# Patient Record
Sex: Female | Born: 1965 | Race: White | Hispanic: No | Marital: Married | Smoking: Never smoker
Health system: Southern US, Community
[De-identification: ages and names within clinical notes are randomized; demographics above are authoritative.]

---

## 2004-11-05 ENCOUNTER — Emergency Department: Payer: Self-pay | Admitting: Emergency Medicine

## 2007-11-15 ENCOUNTER — Ambulatory Visit: Payer: Self-pay | Admitting: Neurosurgery

## 2007-11-28 ENCOUNTER — Ambulatory Visit: Payer: Self-pay | Admitting: Neurosurgery

## 2007-12-29 ENCOUNTER — Encounter (INDEPENDENT_AMBULATORY_CARE_PROVIDER_SITE_OTHER): Payer: Self-pay | Admitting: *Deleted

## 2007-12-29 ENCOUNTER — Encounter: Admission: RE | Admit: 2007-12-29 | Discharge: 2007-12-29 | Payer: Self-pay | Admitting: Neurosurgery

## 2010-10-28 ENCOUNTER — Other Ambulatory Visit: Payer: Self-pay | Admitting: Family Medicine

## 2010-10-28 ENCOUNTER — Ambulatory Visit: Payer: Self-pay | Admitting: Family Medicine

## 2010-10-28 ENCOUNTER — Ambulatory Visit (INDEPENDENT_AMBULATORY_CARE_PROVIDER_SITE_OTHER): Payer: BC Managed Care – PPO | Admitting: Family Medicine

## 2010-10-28 ENCOUNTER — Encounter: Payer: Self-pay | Admitting: Family Medicine

## 2010-10-28 ENCOUNTER — Telehealth: Payer: Self-pay | Admitting: Family Medicine

## 2010-10-28 DIAGNOSIS — Z136 Encounter for screening for cardiovascular disorders: Secondary | ICD-10-CM

## 2010-10-28 DIAGNOSIS — R109 Unspecified abdominal pain: Secondary | ICD-10-CM

## 2010-10-28 LAB — LIPASE: Lipase: 29 U/L (ref 11.0–59.0)

## 2010-10-28 LAB — HEPATIC FUNCTION PANEL
ALT: 12 U/L (ref 0–35)
AST: 17 U/L (ref 0–37)
Albumin: 4.1 g/dL (ref 3.5–5.2)
Alkaline Phosphatase: 66 U/L (ref 39–117)
Total Bilirubin: 0.3 mg/dL (ref 0.3–1.2)

## 2010-10-28 LAB — BASIC METABOLIC PANEL
BUN: 11 mg/dL (ref 6–23)
Chloride: 108 mEq/L (ref 96–112)
Glucose, Bld: 92 mg/dL (ref 70–99)
Potassium: 4.5 mEq/L (ref 3.5–5.1)

## 2010-10-28 LAB — LIPID PANEL: Cholesterol: 142 mg/dL (ref 0–200)

## 2010-10-29 ENCOUNTER — Telehealth: Payer: Self-pay | Admitting: Family Medicine

## 2010-11-03 ENCOUNTER — Encounter (INDEPENDENT_AMBULATORY_CARE_PROVIDER_SITE_OTHER): Payer: Self-pay | Admitting: *Deleted

## 2010-11-03 ENCOUNTER — Other Ambulatory Visit: Payer: BC Managed Care – PPO

## 2010-11-03 ENCOUNTER — Ambulatory Visit (INDEPENDENT_AMBULATORY_CARE_PROVIDER_SITE_OTHER): Payer: BC Managed Care – PPO | Admitting: Gastroenterology

## 2010-11-03 ENCOUNTER — Other Ambulatory Visit: Payer: Self-pay | Admitting: Gastroenterology

## 2010-11-03 ENCOUNTER — Encounter: Payer: Self-pay | Admitting: Gastroenterology

## 2010-11-03 DIAGNOSIS — R1013 Epigastric pain: Secondary | ICD-10-CM

## 2010-11-03 DIAGNOSIS — R109 Unspecified abdominal pain: Secondary | ICD-10-CM

## 2010-11-05 ENCOUNTER — Telehealth: Payer: Self-pay | Admitting: Gastroenterology

## 2010-11-06 NOTE — Progress Notes (Signed)
Summary: wants rx for omeprazole   Phone Note Call from Patient Call back at Home Phone [phone] P PH     Caller: Patient Call For: Ruthe Mannan MD Summary of Call: Patient is asking for a rx for omeprazole to be called in to [facility] [address] becuase it will be cheaper for her if she has a rx.  Initial call taken by: Melody Comas,  October 29, 2010 2:10 PM    New/Updated Medications: OMEPRAZOLE 20 MG CPDR (OMEPRAZOLE) one by mouth daily Prescriptions: OMEPRAZOLE 20 MG CPDR (OMEPRAZOLE) one by mouth daily  #90 x 3   Entered and Authorized by:   Ruthe Mannan MD   Signed by:   Ruthe Mannan MD on 10/29/2010   Method used:   Electronically to        [facility] [address] (retail)       [address]       [place], [place]  [address]       Ph: [phone]       Fax: [phone]   RxID:   [id number]

## 2010-11-06 NOTE — Assessment & Plan Note (Signed)
Summary: NEW PT TO EST/STOMACH PAIN/CLE   Vital Signs:  Patient profile:   45 year old female Height:      67.5 inches Weight:      157.50 pounds BMI:     24.39 Temp:     98.0 degrees F oral Pulse rate:   68 / minute Pulse rhythm:   regular BP sitting:   120 / 82  (left arm) Cuff size:   regular  Vitals Entered By: Linde Gillis CMA Duncan Dull) (October 28, 2010 9:55 AM) CC: new patient, establish care, stomach pains   History of Present Illness: 45 yo here to establish care and to discuss abdominal pain.  Started approx 3 weeks ago.  Started out dull and intermittent. Has progressed to constant 2/10 pain that progresses to 10/10 pain after eating. Pain is mainly epigastric and RUQ. Was associated with nausea last week, no vomiting. No changes in bowel habits.  No fevers.  Taking Omeprazole OTC which has helped a little bit.    Preventive Screening-Counseling & Management  Alcohol-Tobacco     Smoking Status: never      Drug Use:  no.    Current Medications (verified): 1)  Vitamin D3 5000 Unit Caps (Cholecalciferol) .... Take One Tablet By Mouth Daily 2)  Hydrocodone-Acetaminophen 5-325 Mg Tabs (Hydrocodone-Acetaminophen) .Marland Kitchen.. 1 By Mouth Up To 4 Times Per Day As Needed For Pain  Allergies (verified): 1)  ! 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 Eyes:   Denies blurring. ENT:  Denies difficulty swallowing. CV:  Denies chest pain or discomfort. Resp:  Denies shortness of breath. GI:  Complains of abdominal pain and nausea; denies bloody stools, change in bowel habits, vomiting, and yellowish skin color. GU:  Denies dysuria. MS:  Denies joint pain, joint redness, and joint swelling. Derm:  Denies rash. Neuro:  Denies headaches. Psych:  Denies anxiety and depression. Endo:  Denies cold intolerance and heat intolerance. Heme:  Denies abnormal bruising and bleeding.  Physical Exam  General:  alert, well-developed, and well-nourished.   Head:  normocephalic and atraumatic.   Eyes:  vision grossly intact, pupils equal, and pupils round.   Ears:  R ear normal and L ear normal.   Nose:  no external deformity.   Mouth:  good dentition.   Neck:  No deformities, masses, or tenderness noted. Lungs:  Normal respiratory effort, chest expands symmetrically. Lungs are clear to auscultation, no crackles or wheezes. Heart:  Normal rate and regular rhythm. S1 and S2 normal without gallop, murmur, click, rub or other extra sounds. Abdomen:  soft.   TTP over epigastric area, ? pos murphy's sign. No rebound or guarding. Normal bowel sounds, no bruits. Does have bounding abdominal (widened) pulse Msk:  No deformity or scoliosis noted of thoracic or lumbar spine.   Extremities:  No clubbing, cyanosis, edema, or deformity noted with normal full range of motion of  all joints.   Neurologic:  No cranial nerve deficits noted. Station and gait are normal. Plantar reflexes are down-going bilaterally. DTRs are symmetrical throughout. Sensory, motor and coordinative functions appear intact. Skin:  Intact without suspicious lesions or rashes Psych:  Cognition and judgment appear intact. Alert and cooperative with normal attention span and concentration. No apparent delusions, illusions, hallucinations   Impression & Recommendations:  Problem # 1:  ABDOMINAL PAIN  OTHER SPECIFIED SITE (ICD-789.09) Assessment New Differential is wide but I ? possible gall bladder pathology. Other concern is her exaggerated abdominal pulse- per pt, has been like that since she was a child. Pt is a non smoker. Will order abdominal u/s, BMET, liver function tests, lipase.   Her updated medication list for this problem includes:    Hydrocodone-acetaminophen 5-325 Mg Tabs (Hydrocodone-acetaminophen) .Marland Kitchen... 1 by mouth up to 4 times per day as needed for pain  Orders: Radiology Referral (Radiology) TLB-BMP (Basic Metabolic Panel-BMET) (80048-METABOL) TLB-Hepatic/Liver Function Pnl (80076-HEPATIC) TLB-Lipase (83690-LIPASE)  Complete Medication List: 1)  Vitamin D3 5000 Unit Caps (Cholecalciferol) .... Take one tablet by mouth daily 2)  Hydrocodone-acetaminophen 5-325 Mg Tabs (Hydrocodone-acetaminophen) .Marland Kitchen.. 1 by mouth up to 4 times per day as needed for pain  Other Orders: Venipuncture (21308) TLB-Lipid Panel (80061-LIPID)  Patient Instructions: 1)  It was so nice to meet you. 2)  Please stop by to see Shirlee Limerick on your way out to set up your ultrasound.   Prescriptions: HYDROCODONE-ACETAMINOPHEN 5-325 MG TABS (HYDROCODONE-ACETAMINOPHEN) 1 by mouth up to 4 times per day as needed for pain  #30 x 0   Entered and Authorized by:   Ruthe Mannan MD   Signed by:   Ruthe Mannan MD on 10/28/2010   Method used:   Telephoned to ...       [facility] (retail)       [address], [address]       [place], [place]  [address]       Ph: [phone]       Fax: [phone]   RxID:   [id number]    Orders Added: 1)  Radiology Referral [Radiology] 2)  Venipuncture [38756] 3)  TLB-Lipid Panel [80061-LIPID] 4)  TLB-BMP (Basic Metabolic Panel-BMET) [80048-METABOL] 5)  TLB-Hepatic/Liver Function Pnl [80076-HEPATIC] 6)  TLB-Lipase [83690-LIPASE] 7)  New Patient Level II [99202]    Current Allergies (reviewed today): !  Janean Sark     Past Surgical History:    Denies surgical history

## 2010-11-06 NOTE — Progress Notes (Signed)
Summary: called [place] report  Phone Note From Other Clinic   Caller: Parker at outpatient [place] at [facility] Summary of Call: Called report- abd ultrasound negative, no abnormalities seen. Initial call taken by: Lowella Petties CMA, AAMA,  October 28, 2010 11:41 AM  Follow-up for Phone Call        ok thank you.  Please let pt know.  We will need to schedule a GI work up to evaluate further.  I will place referral. Ruthe Mannan MD  October 28, 2010 11:42 AM  Advised patient as instructed via telephone, she would like GI referral.  Advised her to wait to hear from [facility] Follow-up by: Linde Gillis CMA Duncan Dull),  October 28, 2010 4:33 PM

## 2010-11-12 NOTE — Progress Notes (Signed)
Summary: Meds making her sick  Phone Note Call from Patient Call back at Home Phone [phone]   Call For: Dr Christella Hartigan Reason for Call: Talk to Nurse Summary of Call: Arithromicine is making her really sick, she got it on Monday when she was here. Initial call taken by: Leanor Kail [facility],  November 05, 2010 9:25 AM  Follow-up for Phone Call        left message on machine to call back Chales Abrahams CMA Duncan Dull)  November 05, 2010 1:38 PM   pt states the clarithromycin is making her sick and she has not taken it today, I spoke with Amy  and she advises the pt try Pylera.  Allergies were verified and pt advised to take her nexium as well.  she is to stop the amox, and the clarithromycin.  she will call if she has any further problems Follow-up by: Chales Abrahams CMA Duncan Dull),  November 05, 2010 1:52 PM  Additional Follow-up for Phone Call Additional follow up Details #1::        i agre with trial of pylera Additional Follow-up by: Rachael Fee MD,  November 06, 2010 7:55 AM    New/Updated Medications: PYLERA 140-125-125 MG CAPS (BIS SUBCIT-METRONID-TETRACYC) 3 pills four times daily on a full stomach for 9 days Prescriptions: PYLERA 140-125-125 MG CAPS (BIS SUBCIT-METRONID-TETRACYC) 3 pills four times daily on a full stomach for 9 days  #108 x 0   Entered by:   Chales Abrahams CMA (AAMA)   Authorized by:   Rachael Fee MD   Signed by:   Chales Abrahams CMA (AAMA) on 11/05/2010   Method used:   Electronically to        [facility] (retail)       [address], [address]       [place], [place]  [address]       Ph: [phone]       Fax: [phone]   RxID:   [id number]

## 2010-11-12 NOTE — Assessment & Plan Note (Signed)
History of Present Illness Visit Type: Initial Consult Primary GI MD: Rob Bunting MD Primary Provider: Ruthe Mannan, MD Requesting Provider: Ruthe Mannan, MD Chief Complaint: abdominal pain x 3 weeks History of Present Illness:     very pleasant 45 year old woman who has had 3 weeks of abd pains.  feels like a knot.  pain is constant but worsens after eating.   was taking 1600mg  ibuprofen a day off and on for 4 years for lower back pains (ruptured disc L4/L5),  but nearly daily for several months until 2 weeks.  She stopped taking them 2 weeks ago because pains.   started taking omeprazole one pill a day for 5 days, also started ranitidine a week ago.  She has not noticed much improvement since stopping NSAIDs, started PPI.  Thinks she has lost weight.  she had blood work and ultrasound done earlier this week. Ultrasound showed normal gallbladder, normal bile duct. Lab tests showed normal CBC, normal complete metabolic profile, normal pancreatic enzymes.           Current Medications (verified): 1)  Vitamin D3 5000 Unit Caps (Cholecalciferol) .... Take One Tablet By Mouth Daily 2)  Hydrocodone-Acetaminophen 5-325 Mg Tabs (Hydrocodone-Acetaminophen) .Marland Kitchen.. 1 By Mouth Up To 4 Times Per Day As Needed For Pain 3)  Omeprazole 20 Mg Cpdr (Omeprazole) .... One By Mouth Daily 4)  Zantac 150 Mg Tabs (Ranitidine Hcl) .... Take 1 in The Evening As Needed  Allergies (verified): 1)  ! 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  All other review of systems was otherwise negative.   Vital  Signs:  Patient profile:   45 year old female Height:      67.5 inches Weight:      159 pounds BMI:     24.62 Pulse rate:   68 / minute Pulse rhythm:   regular BP sitting:   108 / 60  (left arm)  Vitals Entered By: Milford Cage NCMA (November 03, 2010 9:39 AM)  Physical Exam  Additional Exam:  Constitutional: generally well appearing Psychiatric: alert and oriented times 3 Eyes: extraocular movements intact Mouth: oropharynx moist, no lesions Neck: supple, no lymphadenopathy Cardiovascular: heart regular rate and rythm Lungs: CTA bilaterally Abdomen: soft, Mildly tender upper epigastrium, non-distended, no obvious ascites, no peritoneal signs, normal bowel sounds Extremities: no lower extremity edema bilaterally Skin: no lesions on visible extremities    Impression & Recommendations:  Problem # 1:  epigastric pain, likely peptic ulcer disease related to NSAID use she was taking 1.6 g of ibuprofen daily for several months, years. This can certainly cause an ulcer in the stomach. Her symptoms are  p consistent with gastric or duodenal ulcer. I offered upper endoscopy but she and her husband want to simply treated as if it is an ulcer I think it is completely reasonable. I have changed her proton pump inhibitor. She will take Nexium twice daily for the next 2 weeks at least and then she will back down to once daily proton pump inhibitor. She will absolutely avoid all NSAIDs.  We will test her for H. pylori he antibodies and if positive we will treat her with appropriate antibiotics. She will call my office in 3-4 weeks to report on her symptoms, sooner if needed. I did give her a prescription for higher dose narcotic pain medicine, 30 pills. I do not think she is drug seeking. Her husband is on methadone however and I explained to her that I would not refill these unless we proceed with EGD to confirm the ulcer disease.  Other Orders: TLB-H. Pylori Abs(Helicobacter Pylori)  (86677-HELICO)  Patient Instructions: 1)  You will get lab test(s) done today (h. pylori serology). 2)  Samples of nexium, take one pill twice a day (for two weeks), then back down to once a day nexium (new script written). 3)  Call Dr. Christella Hartigan in 3-4 weeks to report on your symptoms, sooner if needed. 4)  Absolutely avoid NSAIDs. 5)  Tylenol is safe for your stomach but be careful about overall dosing. 6)  New script for vicodin type pain meds.  7)  A copy of this information will be sent to Dr. Dayton Martes. 8)  The medication list was reviewed and reconciled.  All changed / newly prescribed medications were explained.  A complete medication list was provided to the patient / caregiver. Prescriptions: NEXIUM 40 MG  CPDR (ESOMEPRAZOLE MAGNESIUM) 1 capsule each day 30 minutes before meal  #30 x 5   Entered and Authorized by:   Rachael Fee MD   Signed by:   Rachael Fee MD on 11/03/2010   Method used:   Electronically to        [facility]ail)       [address], [address]       [place], [place]  [address]       Ph: [phone]       Fax: [phone]   RxID:   [id number] HYDROCODONE-ACETAMINOPHEN 10-500 MG  TABS (HYDROCODONE-ACETAMINOPHEN) take one pill 2-3 times a day as needed  #30 x 0   Entered and Authorized by:   Rachael Fee MD   Signed by:   Rachael Fee MD on 11/03/2010   Method used:   Print then Give to Patient   RxID:   [id number]   Appended Document: nexium samples    Clinical Lists Changes  Medications: Added new medication of NEXIUM 40 MG CPDR (ESOMEPRAZOLE MAGNESIUM)

## 2010-12-02 NOTE — Letter (Signed)
Summary: Patient medical record  Patient medical record   Imported By: Kassie Mends 11/27/2010 11:37:13  _____________________________________________________________________  External Attachment:    Type:   Image     Comment:   External Document

## 2011-07-01 ENCOUNTER — Encounter: Payer: Self-pay | Admitting: Family Medicine

## 2011-07-01 ENCOUNTER — Ambulatory Visit (INDEPENDENT_AMBULATORY_CARE_PROVIDER_SITE_OTHER): Payer: BC Managed Care – PPO | Admitting: Family Medicine

## 2011-07-01 DIAGNOSIS — R079 Chest pain, unspecified: Secondary | ICD-10-CM | POA: Insufficient documentation

## 2011-07-01 MED ORDER — OMEPRAZOLE 40 MG PO CPDR: DELAYED_RELEASE_CAPSULE | ORAL | Status: DC

## 2011-07-01 NOTE — Progress Notes (Signed)
  Subjective:    Patient ID: Danielle Stokes, female    DOB: [birth year], 45 y.o.   MRN: [id number]  HPI  45 yo here for abrupt onset of CP when she woke up yesterday. Pain is in center of her chest, no radiation. Worsens with palpation and deep breaths. Not really worsened by exertion, only if she takes deep breaths.  No recent URI or illnesses.  Never had anything like this before.  No known trauma.  Pt is a non smoker, no FH of CAD.  H/o GI ulcer, staying away from NSAIDs so has only taken Tylenol for this.  Patient Active Problem List  Diagnoses  . ABDOMINAL PAIN OTHER SPECIFIED SITE  . Chest pain   No past medical history on file. No past surgical history on file. History  Substance Use Topics  . Smoking status: Never Smoker   . Smokeless tobacco: Not on file  . Alcohol Use: No   Family History  Problem Relation Age of Onset  . Arthritis Mother   . Stroke Other    Allergies  Allergen Reactions  . Tramadol Hcl    No current outpatient prescriptions on file prior to visit.         Review of Systems     Objective:   Physical Exam BP 120/86  Pulse 61  Temp(Src) 98.3 F (36.8 C) (Oral)  Ht 5' 7.5" (1.715 m)  Wt 165 lb 4 oz (74.957 kg)  BMI 25.50 kg/m2  LMP 03/21/2009  General:  Well-developed,well-nourished,in no acute distress; alert,appropriate and cooperative throughout examination Head:  normocephalic and atraumatic.   Eyes:  vision grossly intact, pupils equal, pupils round, and pupils reactive to light.   Ears:  R ear normal and L ear normal.   Nose:  no external deformity.   Mouth:  good dentition.   Neck:  No deformities, masses, or tenderness noted. Chest wall: TTP over costochondrol junction of chest, no erythema or warmth Lungs:  Normal respiratory effort, chest expands symmetrically. Lungs are clear to auscultation, no crackles or wheezes. Heart:  Normal rate and regular rhythm. S1 and S2 normal without gallop, murmur, click, rub or  other extra sounds. Psych:  Cognition and judgment appear intact. Alert and cooperative with normal attention span and concentration. No apparent delusions, illusions, hallucinations      Assessment & Plan:   1. Chest pain   New.  Very consistent with costochondritis. Advised supportive care- see pt instructions for details. EKG unremarkable- NSR with low volatege (likely her normal variant).

## 2011-07-01 NOTE — Patient Instructions (Signed)
Good to see you. Please let me know if you're symptoms are not better in next few days. Go straight to ER if symptoms worsen.  Costochondritis (Costochondral Separation / Tietze Syndrome) Costochondritis (Tietze syndrome), or costochondral separation, is a swelling and irritation (inflammation) of the tissue (cartilage) that connects your ribs with your breastbone (sternum). It may occur on its own (spontaneously), through damage caused by an accident (trauma), or simply from coughing or minor exercise. It may take up to 6 weeks to get better and longer if you are unable to be conservative in your activities. HOME CARE INSTRUCTIONS  Avoid exhausting physical activity. Try not to strain your ribs during normal activity. This would include any activities using chest, belly (abdominal) and side muscles, especially if heavy weights are used.   Only take over-the-counter or prescription medicines for pain, discomfort, or fever as directed by your caregiver.  SEEK IMMEDIATE MEDICAL CARE IF:  Your pain increases or you are very uncomfortable.   You develop worse chest pains, shortness of breath, sweating, or vomiting.   You develop new, unexplained problems (symptoms).  MAKE SURE YOU:   Understand these instructions.   Will watch your condition.   Will get help right away if you are not doing well or get worse.  Document Released: 06/17/2005 Document Re-Released: 12/02/2009 [facility] Patient Information 2011 Standard, [place].

## 2011-09-01 ENCOUNTER — Other Ambulatory Visit: Payer: Self-pay | Admitting: Family Medicine

## 2012-07-25 IMAGING — US ABDOMEN ULTRASOUND LIMITED
1 series · 17 of 25 positions shown · non-contrast
Comparison: none

REASON FOR EXAM: CR [id number]  RUQ abdominal pain eval gallbladder and
aorta for widened abdomi...
COMMENTS:

[Series 1: abdomen ultrasound limited · 17 of 67 slices shown]
[im 1/67]
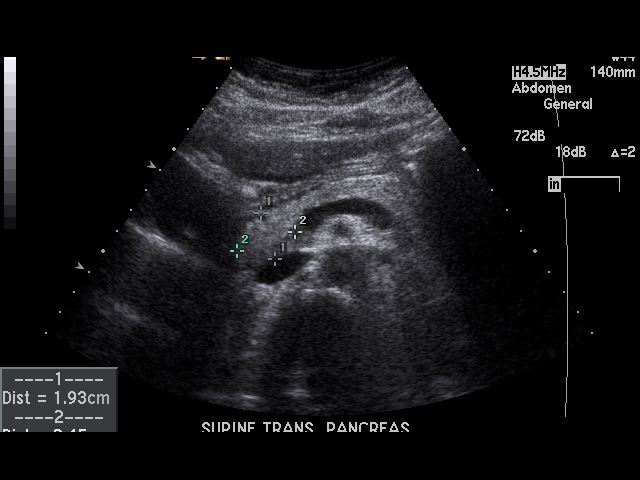
[im 6/67]
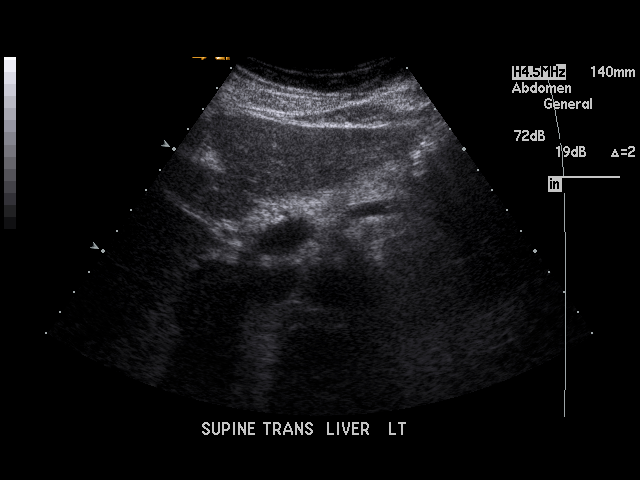
[im 9/67]
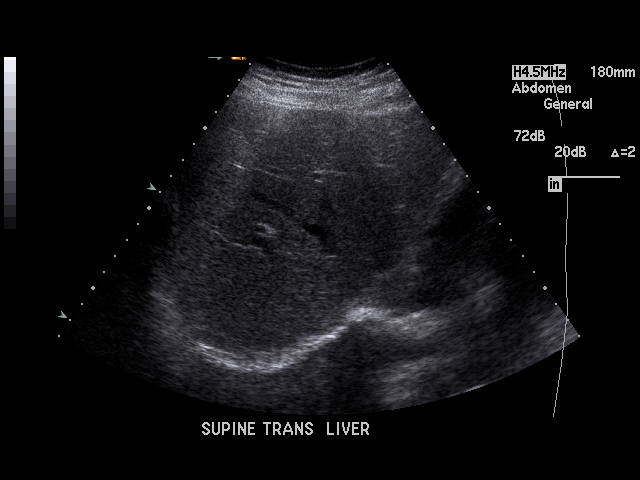
[im 14/67]
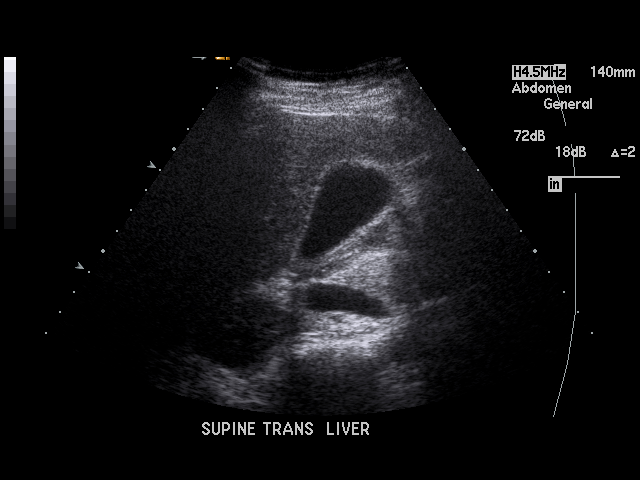
[im 17/67]
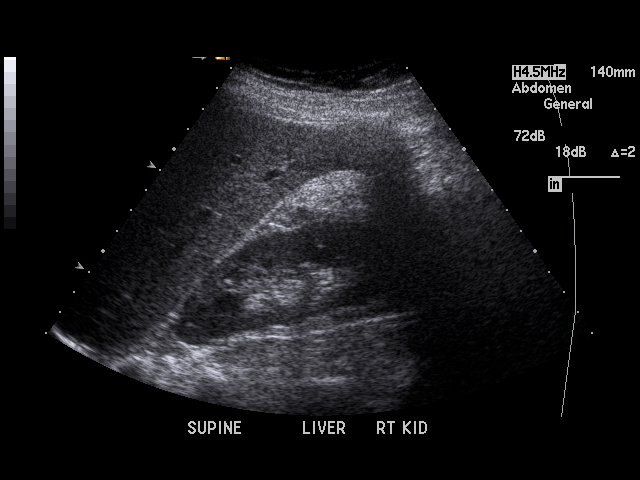
[im 23/67]
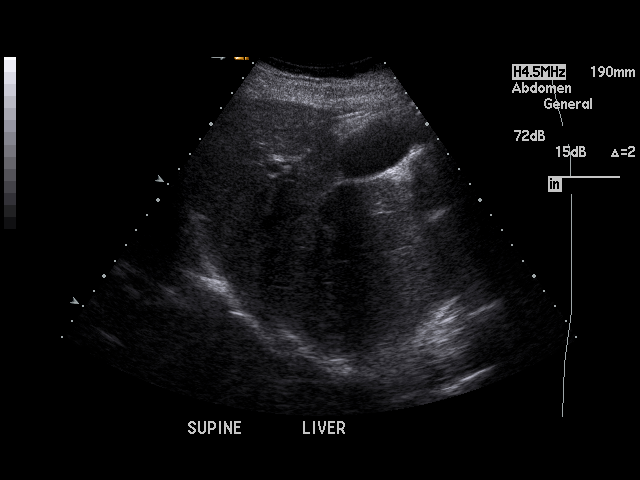
[im 25/67]
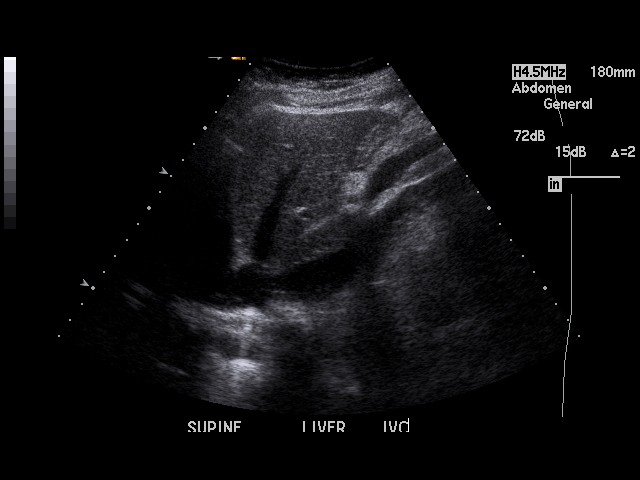
[im 31/67]
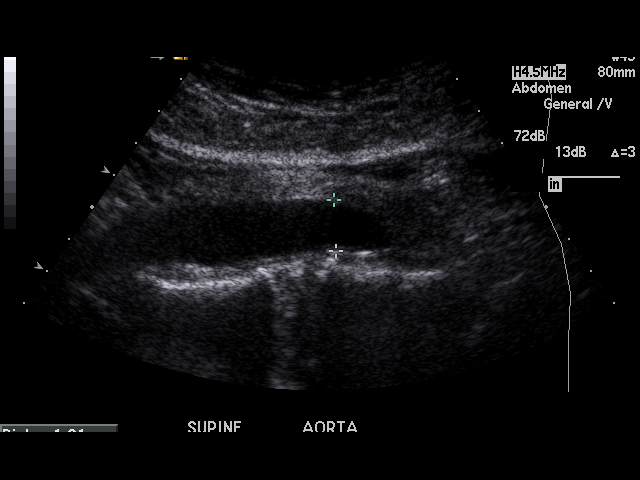
[im 34/67]
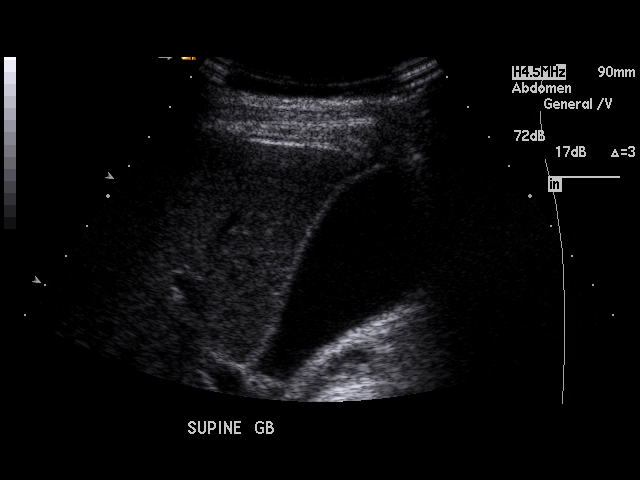
[im 36/67]
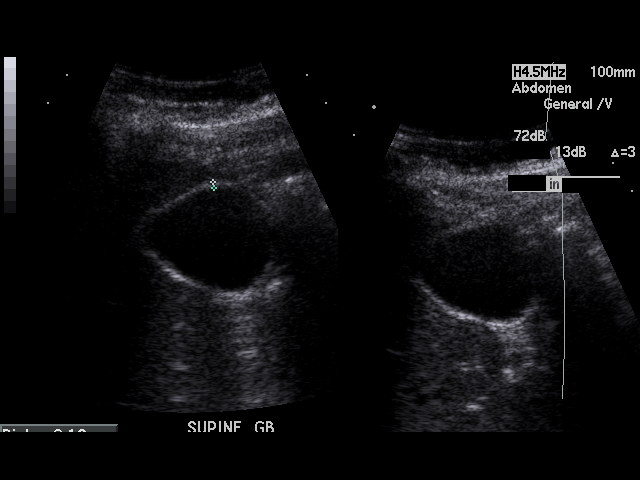
[im 42/67]
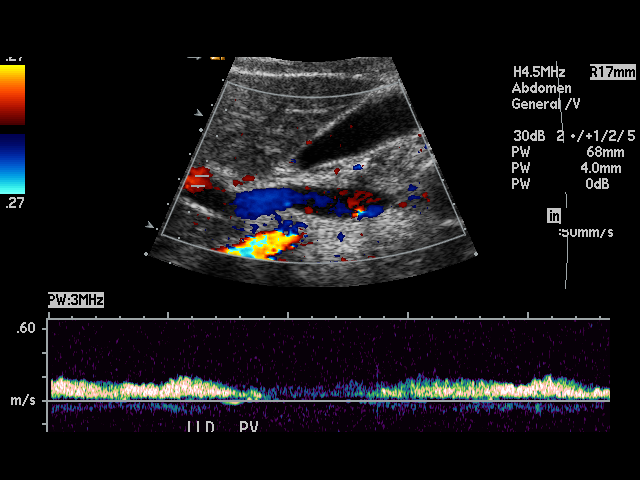
[im 45/67]
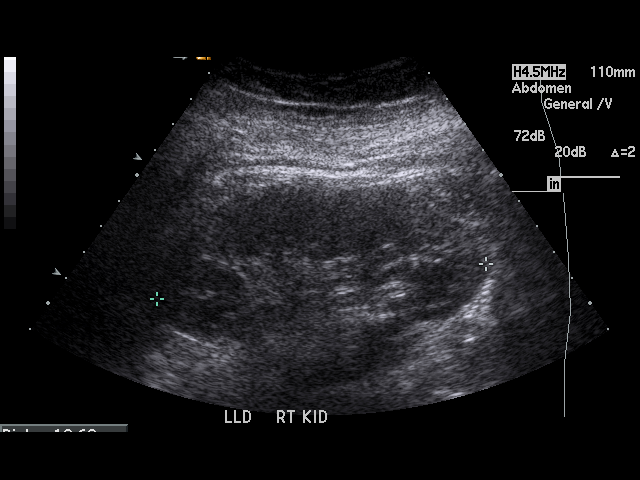
[im 50/67]
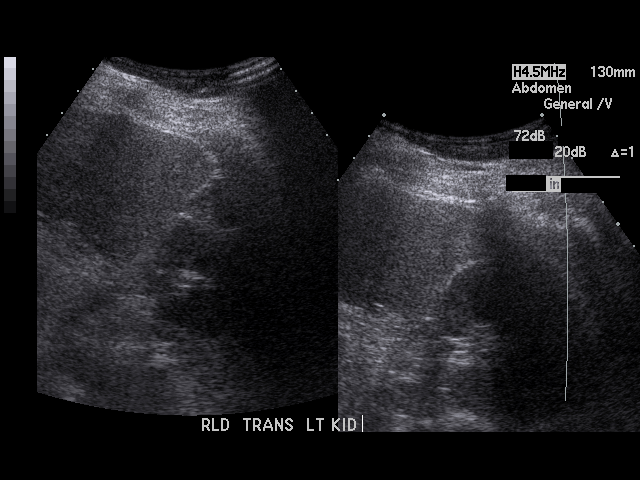
[im 53/67]
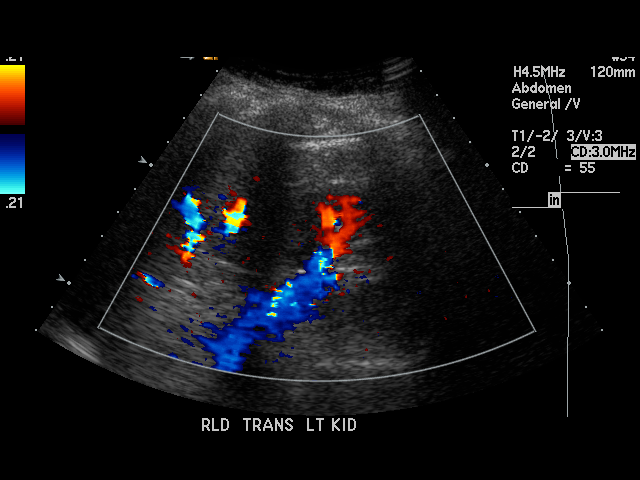
[im 58/67]
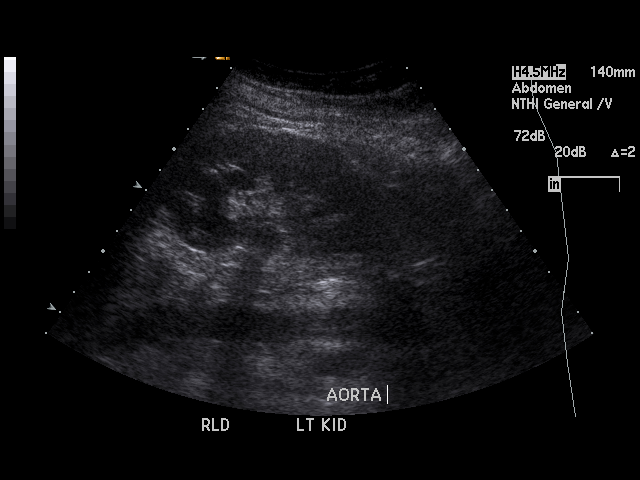
[im 61/67]
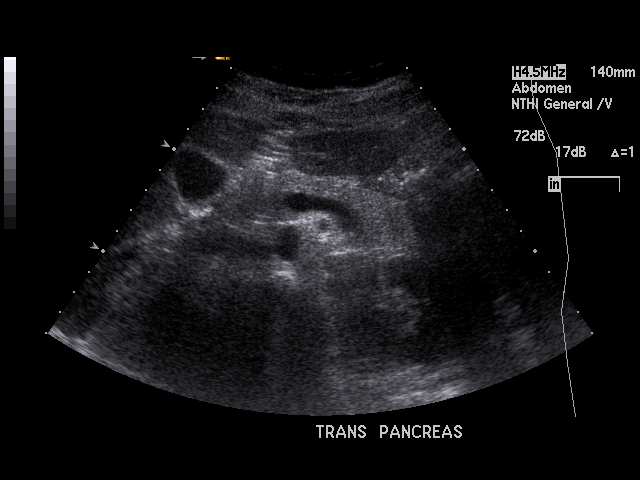
[im 67/67]
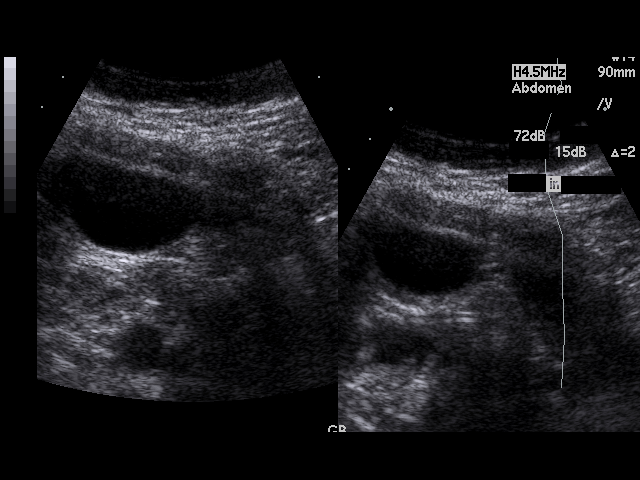

[17 of 25 positions shown; findings below may reference images not displayed]

PROCEDURE:     ML - ML ABDOMEN UPPER GENERAL  - October 28, 2010 [DATE]

RESULT:     Sonographic evaluation of the abdomen demonstrates the
visualized pancreas appears normal. There is fairly good visualization of
the entire pancreas. The liver is normal in echotexture and size without
ductal dilation. The right kidney length is 10.6 cm. The left kidney length
is 10.8 cm. The gallbladder is fluid-filled without stones. The common bile
duct diameter is 2.8 mm. The gallbladder wall is 1.8 mm. The spleen, aorta
and inferior vena cava appear to be normal. The portal venous flow is
normal. No sonographic Murphy's sign is present.
IMPRESSION: Normal appearing abdominal sonogram.

## 2012-10-27 ENCOUNTER — Other Ambulatory Visit: Payer: Self-pay | Admitting: Family Medicine

## 2013-01-11 ENCOUNTER — Other Ambulatory Visit: Payer: Self-pay | Admitting: Family Medicine

## 2016-06-20 ENCOUNTER — Emergency Department
Admission: EM | Admit: 2016-06-20 | Discharge: 2016-06-20 | Disposition: A | Discharge status: Home / Self Care | Payer: BLUE CROSS/BLUE SHIELD | Attending: Student | Admitting: Student

## 2016-06-20 ENCOUNTER — Emergency Department: Payer: BLUE CROSS/BLUE SHIELD

## 2016-06-20 ENCOUNTER — Encounter: Payer: Self-pay | Admitting: Emergency Medicine

## 2016-06-20 DIAGNOSIS — Y9302 Activity, running: Secondary | ICD-10-CM | POA: Insufficient documentation

## 2016-06-20 DIAGNOSIS — S42251A Displaced fracture of greater tuberosity of right humerus, initial encounter for closed fracture: Secondary | ICD-10-CM | POA: Insufficient documentation

## 2016-06-20 DIAGNOSIS — Y999 Unspecified external cause status: Secondary | ICD-10-CM | POA: Diagnosis not present

## 2016-06-20 DIAGNOSIS — W010XXA Fall on same level from slipping, tripping and stumbling without subsequent striking against object, initial encounter: Secondary | ICD-10-CM | POA: Diagnosis not present

## 2016-06-20 DIAGNOSIS — S4991XA Unspecified injury of right shoulder and upper arm, initial encounter: Secondary | ICD-10-CM | POA: Diagnosis present

## 2016-06-20 DIAGNOSIS — Y92000 Kitchen of unspecified non-institutional (private) residence as  the place of occurrence of the external cause: Secondary | ICD-10-CM | POA: Insufficient documentation

## 2016-06-20 DIAGNOSIS — W19XXXA Unspecified fall, initial encounter: Secondary | ICD-10-CM

## 2016-06-20 MED ORDER — HYDROCODONE-ACETAMINOPHEN 5-325 MG PO TABS: 1.0000 | ORAL_TABLET | ORAL | 0 refills | Status: AC | PRN

## 2016-06-20 MED ORDER — HYDROMORPHONE HCL 1 MG/ML IJ SOLN
1.0000 mg | Freq: Once | INTRAMUSCULAR | Status: AC
Start: 2016-06-20 — End: 2016-06-20
  Administered 2016-06-20: 1 mg via INTRAVENOUS
  Filled 2016-06-20: qty 1

## 2016-06-20 MED ORDER — ONDANSETRON HCL 4 MG/2ML IJ SOLN
4.0000 mg | Freq: Once | INTRAMUSCULAR | Status: AC
  Administered 2016-06-20: 4 mg via INTRAVENOUS
  Filled 2016-06-20: qty 2

## 2016-06-20 NOTE — ED Provider Notes (Signed)
[facility] Emergency Department Provider Note   ____________________________________________   None    (approximate)  I have reviewed the triage vital signs and the nursing notes.   HISTORY  Chief Complaint Arm Injury    HPI Danielle Stokes is a 50 y.o. female presents for evaluation of right shoulder pain status post fall prior to arrival. Patient states she fell on an outstretched arm and has had severe pain in her upper right shoulder area ever since. Denies any loss consciousness. Denies any numbness or tingling.   History reviewed. No pertinent past medical history.  Patient Active Problem List   Diagnosis Date Noted  . Chest pain 07/01/2011  . ABDOMINAL PAIN OTHER SPECIFIED SITE 10/28/2010    History reviewed. No pertinent surgical history.  Prior to Admission medications   Medication Sig Start Date End Date Taking? Authorizing Provider  Cholecalciferol (VITAMIN D-3) 5000 UNITS TABS Take 1 tablet by mouth daily.      Historical Provider, MD  HYDROcodone-acetaminophen (NORCO) 5-325 MG tablet Take 1-2 tablets by mouth every 4 (four) hours as needed for moderate pain. 06/20/16   Charmayne Sheer Sady Monaco, PA-C  omeprazole (PRILOSEC) 40 MG capsule TAKE ONE CAPSULE BY MOUTH EVERY DAY 10/27/12   Dianne Dun, MD    Allergies Ibuprofen; Percocet [oxycodone-acetaminophen]; and Tramadol hcl  Family History  Problem Relation Age of Onset  . Stroke Other   . Arthritis Mother     Social History Social History  Substance Use Topics  . Smoking status: Never Smoker  . Smokeless tobacco: Never Used  . Alcohol use No    Review of Systems Constitutional: No fever/chills Musculoskeletal: Positive for right shoulder pain. Skin: Negative for rash. Neurological: Negative for headaches, focal weakness or numbness.  10-point ROS otherwise negative.  ____________________________________________   PHYSICAL EXAM:  VITAL SIGNS: ED Triage Vitals  [06/20/16 1259]  Enc Vitals Group     BP 116/75     Pulse Rate 89     Resp 18     Temp 98.2 F (36.8 C)     Temp Source Oral     SpO2 98 %     Weight 162 lb (73.5 kg)     Height 5' 7.5" (1.715 m)     Head Circumference      Peak Flow      Pain Score 7     Pain Loc      Pain Edu?      Excl. in GC?     Constitutional: Alert and oriented. Well appearing and in no acute distress. Gastrointestinal: Soft and nontender. No distention. No abdominal bruits. No CVA tenderness. Musculoskeletal: Right arm currently in a sling with limited range of motion. Unable to abduct and adduct extend. Distally neurovascularly intact with good capillary refill. Neurologic:  Normal speech and language. No gross focal neurologic deficits are appreciated. Skin:  Skin is warm, dry and intact. No rash noted. Psychiatric: Mood and affect are normal. Speech and behavior are normal.  ____________________________________________   LABS (all labs ordered are listed, but only abnormal results are displayed)  Labs Reviewed - No data to display ____________________________________________  EKG   ____________________________________________  RADIOLOGY  IMPRESSION:  Minimally displaced fracture involving the greater tuberosity of the  proximal humerus.    ____________________________________________   PROCEDURES  Procedure(s) performed: None  Procedures  Critical Care performed: No  ____________________________________________   INITIAL IMPRESSION / ASSESSMENT AND PLAN / ED COURSE  Pertinent labs & imaging results  that were available during my care of the patient were reviewed by me and considered in my medical decision making (see chart for details).  Acute right humeral fracture. Rx given for Vicodin 5/325. Patient follow-up with Dr. Hyacinth MeekerMiller, the orthopedic doctor on call. Discussed all clinical finding with orthopedics.  Clinical Course      ____________________________________________   FINAL CLINICAL IMPRESSION(S) / ED DIAGNOSES  Final diagnoses:  Fall, initial encounter  Greater tuberosity of humerus fracture, right, closed, initial encounter      NEW MEDICATIONS STARTED DURING THIS VISIT:  New Prescriptions   HYDROCODONE-ACETAMINOPHEN (NORCO) 5-325 MG TABLET    Take 1-2 tablets by mouth every 4 (four) hours as needed for moderate pain.     Note:  This document was prepared using Dragon voice recognition software and may include unintentional dictation errors.   Evangeline Dakinharles M Yesika Rispoli, PA-C 06/20/16 1443    Gayla DossEryka A Gayle, MD 06/20/16 [phone]

## 2016-06-20 NOTE — ED Triage Notes (Signed)
Pt presents c/o possible R arm fracture near shoulder joint. Pt states she ran to catch her dog and tripped and fell onto kitchen floor. [facility]. Sensation intact, able to move fingers.

## 2016-06-20 NOTE — ED Notes (Signed)
See triage note  States she tripped last pm  Having pain to right uppr arm/shoulder area.Danielle Stokes. Positive pulses

## 2017-12-09 ENCOUNTER — Other Ambulatory Visit: Payer: BLUE CROSS/BLUE SHIELD

## 2018-03-18 IMAGING — CR DG SHOULDER 2+V*R*
1 series · 3 of 3 positions shown · non-contrast
Comparison: None.

CLINICAL DATA: Pain.  Fall.

EXAM:
RIGHT SHOULDER - 2+ VIEW

[Series 1: dg shoulder right · 0.14mm/px · 3 of 3 slices shown]
[im 1/3]
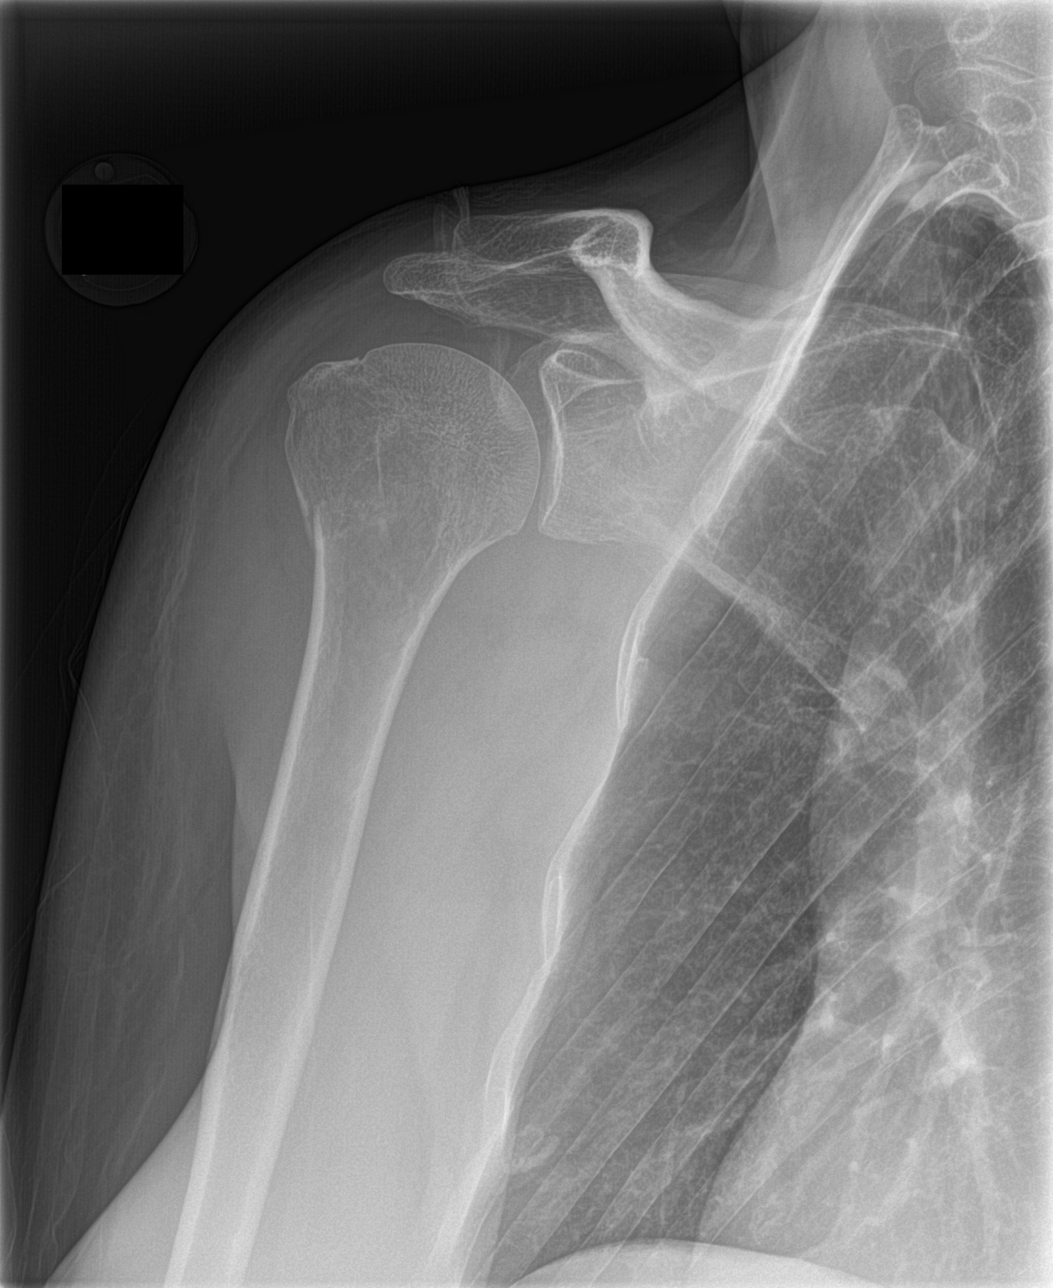
[im 2/3]
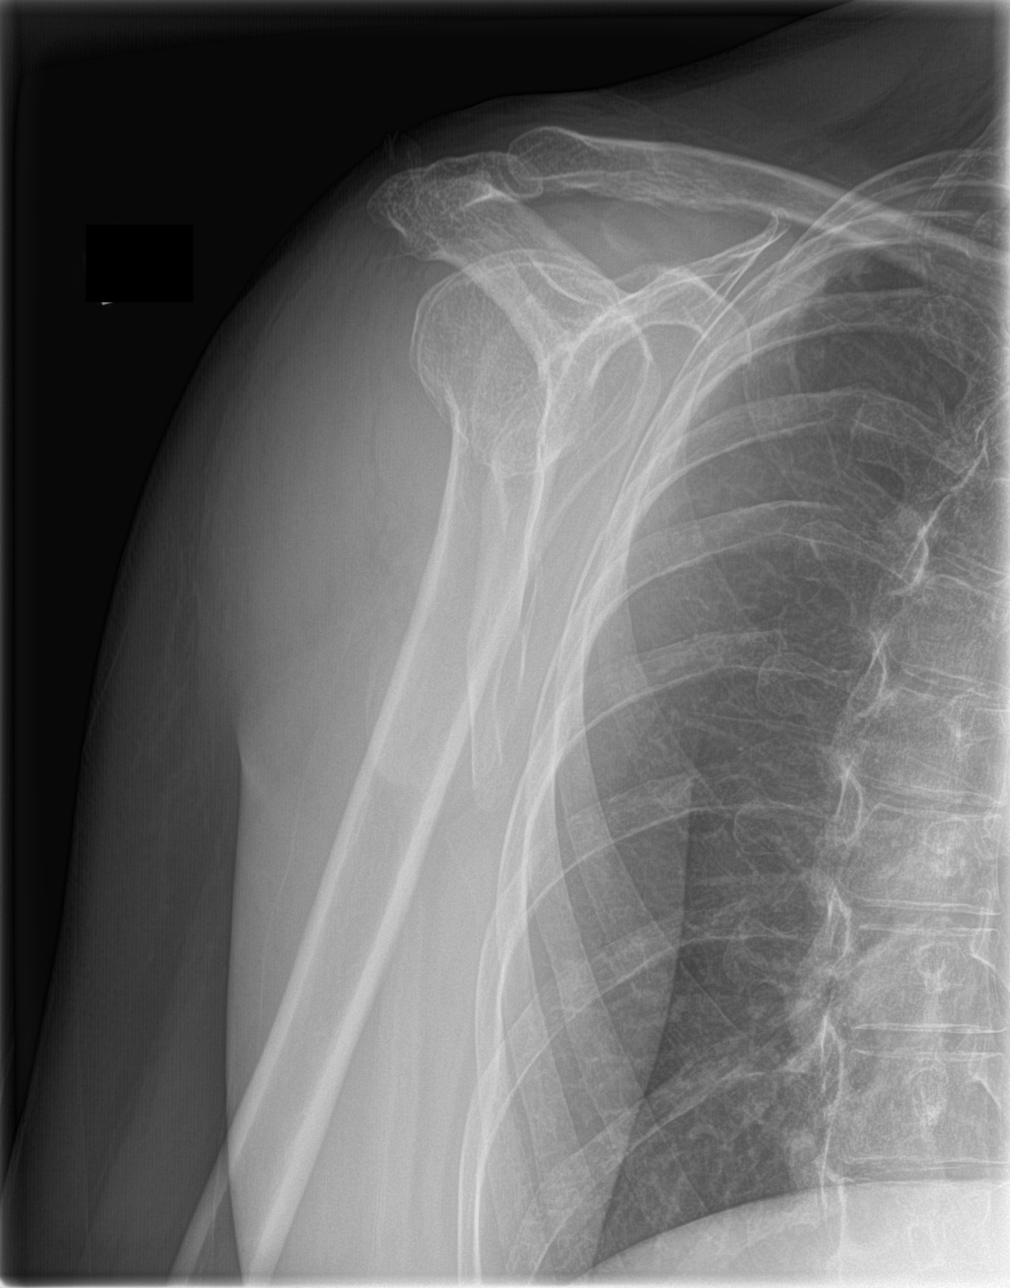
[im 3/3]
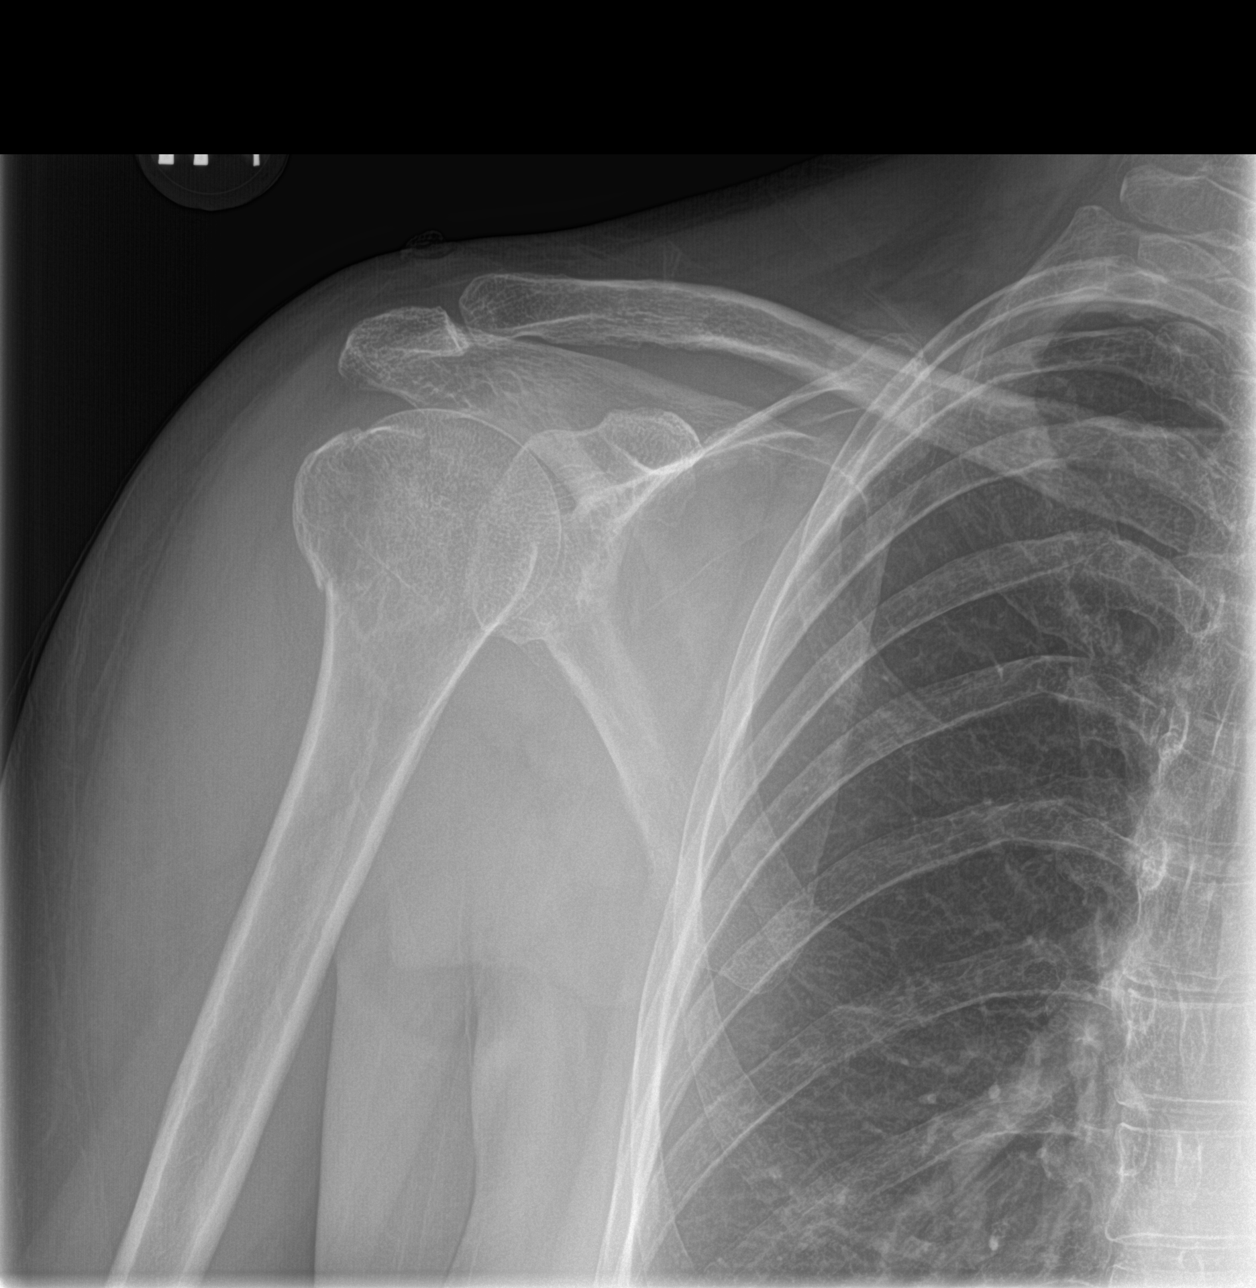

[3 of 3 positions shown; findings below may reference images not displayed]

FINDINGS: There is a minimally displaced fracture involving the proximal
humerus. The fracture involves the greater tuberosity. Humeral neck
is grossly intact. Osteopenia. Scapula and clavicle are grossly
intact.
IMPRESSION: Minimally displaced fracture involving the greater tuberosity of the
proximal humerus.

## 2022-07-29 ENCOUNTER — Ambulatory Visit
Admission: EM | Admit: 2022-07-29 | Discharge: 2022-07-29 | Disposition: A | Discharge status: Home / Self Care | Payer: BC Managed Care – PPO

## 2022-07-29 DIAGNOSIS — H9203 Otalgia, bilateral: Secondary | ICD-10-CM | POA: Diagnosis not present

## 2022-07-29 DIAGNOSIS — J069 Acute upper respiratory infection, unspecified: Secondary | ICD-10-CM | POA: Diagnosis not present

## 2022-07-29 NOTE — ED Provider Notes (Signed)
[facility]    CSN: [id number] Arrival date & time: 07/29/22  1211      History   Chief Complaint Chief Complaint  Patient presents with   Otalgia    HPI Danielle Stokes is a 56 y.o. female. Patient presents with 3 day history of bilateral ear pain, worse on left.  She also reports congestion and mild cough.  No fever, rash, shortness of breath, vomiting, diarrhea, or other symptoms.  Treatment at home with Tylenol.    The history is provided by the patient and medical records.    History reviewed. No pertinent past medical history.  Patient Active Problem List   Diagnosis Date Noted   Chest pain 07/01/2011   ABDOMINAL PAIN OTHER SPECIFIED SITE 10/28/2010    History reviewed. No pertinent surgical history.  OB History   No obstetric history on file.      Home Medications    Prior to Admission medications   Medication Sig Start Date End Date Taking? Authorizing Provider  Cholecalciferol (VITAMIN D-3) 5000 UNITS TABS Take 1 tablet by mouth daily.      [provider]  HYDROcodone-acetaminophen (NORCO) 5-325 MG tablet Take 1-2 tablets by mouth every 4 (four) hours as needed for moderate pain. 06/20/16   Beers, Charmayne Sheer, PA-C  omeprazole (PRILOSEC) 40 MG capsule TAKE ONE CAPSULE BY MOUTH EVERY DAY 10/27/12   Dianne Dun, MD    Family History Family History  Problem Relation Age of Onset   Stroke Other    Arthritis Mother     Social History Social History   Tobacco Use   Smoking status: Never   Smokeless tobacco: Never  Substance Use Topics   Alcohol use: No   Drug use: No     Allergies   Ibuprofen, Percocet [oxycodone-acetaminophen], and Tramadol hcl   Review of Systems Review of Systems  Constitutional:  Negative for chills and fever.  HENT:  Positive for congestion and ear pain. Negative for sore throat.   Respiratory:  Positive for cough. Negative for shortness of breath.   Cardiovascular:  Negative for chest pain and  palpitations.  Gastrointestinal:  Negative for diarrhea and vomiting.  Skin:  Negative for rash.  All other systems reviewed and are negative.    Physical Exam Triage Vital Signs ED Triage Vitals  Enc Vitals Group     BP 07/29/22 1237 110/75     Pulse Rate 07/29/22 1231 68     Resp 07/29/22 1231 18     Temp 07/29/22 1231 98.4 F (36.9 C)     Temp src --      SpO2 07/29/22 1231 97 %     Weight 07/29/22 1235 162 lb (73.5 kg)     Height 07/29/22 1235 5' 7.5" (1.715 m)     Head Circumference --      Peak Flow --      Pain Score 07/29/22 1235 7     Pain Loc --      Pain Edu? --      Excl. in GC? --    No data found.  Updated Vital Signs BP 110/75   Pulse 68   Temp 98.4 F (36.9 C)   Resp 18   Ht 5' 7.5" (1.715 m)   Wt 162 lb (73.5 kg)   LMP 03/21/2009   SpO2 97%   BMI 25.00 kg/m   Visual Acuity Right Eye Distance:   Left Eye Distance:   Bilateral Distance:  Right Eye Near:   Left Eye Near:    Bilateral Near:     Physical Exam Vitals and nursing note reviewed.  Constitutional:      General: She is not in acute distress.    Appearance: Normal appearance. She is well-developed. She is not ill-appearing.  HENT:     Right Ear: Tympanic membrane and ear canal normal.     Left Ear: Tympanic membrane and ear canal normal.     Nose: Nose normal.     Mouth/Throat:     Mouth: Mucous membranes are moist.     Pharynx: Oropharynx is clear.  Cardiovascular:     Rate and Rhythm: Normal rate and regular rhythm.     Heart sounds: Normal heart sounds.  Pulmonary:     Effort: Pulmonary effort is normal. No respiratory distress.     Breath sounds: Normal breath sounds.  Musculoskeletal:     Cervical back: Neck supple.  Skin:    General: Skin is warm and dry.  Neurological:     Mental Status: She is alert.  Psychiatric:        Mood and Affect: Mood normal.        Behavior: Behavior normal.      UC Treatments / Results  Labs (all labs ordered are listed, but  only abnormal results are displayed) Labs Reviewed - No data to display  EKG   Radiology No results found.  Procedures Procedures (including critical care time)  Medications Ordered in UC Medications - No data to display  Initial Impression / Assessment and Plan / UC Course  I have reviewed the triage vital signs and the nursing notes.  Pertinent labs & imaging results that were available during my care of the patient were reviewed by me and considered in my medical decision making (see chart for details).    Viral URI, otalgia.  No indication of ear infection today.  Discussed symptomatic treatment including Flonase, Mucinex, Tylenol, rest, hydration.  Instructed patient to follow up with PCP if symptoms are not improving.  She agrees to plan of care.   Final Clinical Impressions(s) / UC Diagnoses   Final diagnoses:  Viral URI  Otalgia of both ears     Discharge Instructions      Take plain Mucinex and use Flonase as directed.  Follow up with your primary care provider if your symptoms are not improving.        ED Prescriptions   None    PDMP not reviewed this encounter.   Mickie Bail, NP 07/29/22 1301

## 2022-07-29 NOTE — Discharge Instructions (Addendum)
Take plain Mucinex and use Flonase as directed.  Follow up with your primary care provider if your symptoms are not improving.

## 2022-07-29 NOTE — ED Triage Notes (Addendum)
Patient to Urgent Care with complaints of left sided ear pain x3 days. Reports some right sided ear pain when she lies on that side. Has been taking tylenol.  Denies any known fevers.
